# Patient Record
Sex: Male | Born: 1975 | Race: White | Hispanic: No | Marital: Single | Smoking: Never smoker
Health system: Southern US, Community
[De-identification: ages and names within clinical notes are randomized; demographics above are authoritative.]

## PROBLEM LIST (undated history)

## (undated) DIAGNOSIS — Z8614 Personal history of Methicillin resistant Staphylococcus aureus infection: Secondary | ICD-10-CM

## (undated) DIAGNOSIS — N289 Disorder of kidney and ureter, unspecified: Secondary | ICD-10-CM

## (undated) DIAGNOSIS — N200 Calculus of kidney: Secondary | ICD-10-CM

## (undated) DIAGNOSIS — F419 Anxiety disorder, unspecified: Secondary | ICD-10-CM

## (undated) HISTORY — DX: Anxiety disorder, unspecified: F41.9

---

## 2011-09-03 ENCOUNTER — Emergency Department (HOSPITAL_COMMUNITY)
Admission: EM | Admit: 2011-09-03 | Discharge: 2011-09-03 | Disposition: A | Discharge status: Home / Self Care | Payer: Self-pay | Attending: Emergency Medicine | Admitting: Emergency Medicine

## 2011-09-03 ENCOUNTER — Encounter: Payer: Self-pay | Admitting: *Deleted

## 2011-09-03 ENCOUNTER — Emergency Department (HOSPITAL_COMMUNITY): Payer: Self-pay

## 2011-09-03 DIAGNOSIS — K5289 Other specified noninfective gastroenteritis and colitis: Secondary | ICD-10-CM | POA: Insufficient documentation

## 2011-09-03 DIAGNOSIS — R109 Unspecified abdominal pain: Secondary | ICD-10-CM | POA: Insufficient documentation

## 2011-09-03 DIAGNOSIS — K529 Noninfective gastroenteritis and colitis, unspecified: Secondary | ICD-10-CM

## 2011-09-03 DIAGNOSIS — R112 Nausea with vomiting, unspecified: Secondary | ICD-10-CM | POA: Insufficient documentation

## 2011-09-03 DIAGNOSIS — M549 Dorsalgia, unspecified: Secondary | ICD-10-CM | POA: Insufficient documentation

## 2011-09-03 DIAGNOSIS — R350 Frequency of micturition: Secondary | ICD-10-CM | POA: Insufficient documentation

## 2011-09-03 DIAGNOSIS — R197 Diarrhea, unspecified: Secondary | ICD-10-CM | POA: Insufficient documentation

## 2011-09-03 HISTORY — DX: Personal history of Methicillin resistant Staphylococcus aureus infection: Z86.14

## 2011-09-03 HISTORY — DX: Disorder of kidney and ureter, unspecified: N28.9

## 2011-09-03 HISTORY — DX: Calculus of kidney: N20.0

## 2011-09-03 LAB — COMPREHENSIVE METABOLIC PANEL
BUN: 11 mg/dL (ref 6–23)
CO2: 21 mEq/L (ref 19–32)
Calcium: 9.7 mg/dL (ref 8.4–10.5)
GFR calc Af Amer: >90 mL/min (ref >90)
GFR calc non Af Amer: >90 mL/min (ref >90)
Glucose, Bld: 146 mg/dL — ABNORMAL HIGH (ref 70–99)
Total Protein: 7.3 g/dL (ref 6.0–8.3)

## 2011-09-03 LAB — CBC
HCT: 46.3 % (ref 39.0–52.0)
Hemoglobin: 17.1 g/dL — ABNORMAL HIGH (ref 13.0–17.0)
MCH: 32.3 pg (ref 26.0–34.0)
MCV: 87.4 fL (ref 78.0–100.0)
RBC: 5.3 MIL/uL (ref 4.22–5.81)

## 2011-09-03 LAB — URINALYSIS, ROUTINE W REFLEX MICROSCOPIC
Bilirubin Urine: NEGATIVE
Nitrite: NEGATIVE
Protein, ur: NEGATIVE mg/dL
Specific Gravity, Urine: 1.013 (ref 1.005–1.030)
Urobilinogen, UA: 0.2 mg/dL (ref 0.0–1.0)

## 2011-09-03 LAB — DIFFERENTIAL
Eosinophils Absolute: 0 10*3/uL (ref 0.0–0.7)
Eosinophils Relative: 0 % (ref 0–5)
Lymphs Abs: 1.5 10*3/uL (ref 0.7–4.0)
Monocytes Absolute: 0.8 10*3/uL (ref 0.1–1.0)
Monocytes Relative: 6 % (ref 3–12)

## 2011-09-03 LAB — LIPASE, BLOOD: Lipase: 20 U/L (ref 11–59)

## 2011-09-03 MED ORDER — MORPHINE SULFATE 4 MG/ML IJ SOLN
4.0000 mg | Freq: Once | INTRAMUSCULAR | Status: AC
  Administered 2011-09-03: 4 mg via INTRAVENOUS
  Filled 2011-09-03: qty 1

## 2011-09-03 MED ORDER — ONDANSETRON HCL 4 MG/2ML IJ SOLN
4.0000 mg | Freq: Once | INTRAMUSCULAR | Status: AC
  Administered 2011-09-03: 4 mg via INTRAVENOUS
  Filled 2011-09-03: qty 2

## 2011-09-03 MED ORDER — SODIUM CHLORIDE 0.9 % IV BOLUS (SEPSIS)
1000.0000 mL | Freq: Once | INTRAVENOUS | Status: AC
  Administered 2011-09-03: 1000 mL via INTRAVENOUS

## 2011-09-03 MED ORDER — SODIUM CHLORIDE 0.9 % IV SOLN
Freq: Once | INTRAVENOUS | Status: AC
Start: 2011-09-03 — End: 2011-09-03
  Administered 2011-09-03: 09:00:00 via INTRAVENOUS

## 2011-09-03 MED ORDER — ONDANSETRON HCL 4 MG PO TABS: 4.0000 mg | ORAL_TABLET | Freq: Four times a day (QID) | ORAL | Status: AC

## 2011-09-03 MED ORDER — IOHEXOL 300 MG/ML  SOLN
100.0000 mL | Freq: Once | INTRAMUSCULAR | Status: AC | PRN
  Administered 2011-09-03: 100 mL via INTRAVENOUS

## 2011-09-03 MED ORDER — OXYCODONE-ACETAMINOPHEN 5-325 MG PO TABS: 2.0000 | ORAL_TABLET | ORAL | Status: AC | PRN

## 2011-09-03 NOTE — ED Notes (Signed)
Pt complaining of generalized abdominal pain. Pt states that the pain is also in his back. Pt states that he has been having difficulty urinating. Pt in severe pain. Pt unable to fully answer questions at this time. Report given to [facility]

## 2011-09-03 NOTE — ED Notes (Signed)
To CT scan

## 2011-09-03 NOTE — ED Notes (Signed)
Family at bedside. 

## 2011-09-03 NOTE — ED Notes (Signed)
Patient is resting comfortably. 

## 2011-09-03 NOTE — ED Notes (Signed)
Family at bedside. Reports pain med received earlier not working, requested more pain med. Medicated per order. Oral contrast given for CT scan. Encouraged to drink. States nausea improved "a little".

## 2011-09-03 NOTE — ED Notes (Signed)
Family at bedside. Finished drinking contrast, CT notified.

## 2011-09-03 NOTE — ED Notes (Addendum)
C/o stomach pain. "feels like i have been having a UTI for ~ 1 month", stomach pain onset ~ 12 hrs, nv onset ~ 6 hrs, pt writhing in pain and with active nv. Also reports back pain with h/o kidney stones. Unable to pinpoint pain, also mentions CP. Last kidney stone on Left ~ 5-6 yrs ago. Pt taken straight back to room via w/c on arrival to triage.

## 2011-09-03 NOTE — ED Provider Notes (Addendum)
History     CSN: [id number] Arrival date & time: 09/03/2011  7:14 AM   First MD Initiated Contact with Patient 09/03/11 [id number]      Chief Complaint  Patient presents with  . Abdominal Pain    also flank pain, UTI sx, CP, nv    (Consider location/radiation/quality/duration/timing/severity/associated sxs/prior treatment) Patient is a 35 y.o. male presenting with abdominal pain.  Abdominal Pain The primary symptoms of the illness include abdominal pain.  Additional symptoms associated with the illness include frequency and back pain. Symptoms associated with the illness do not include chills, constipation or hematuria.   patient states his symptoms began approximately 12 hours ago. This includes diffuse abdominal pain, back pain, nausea, vomiting, and diarrhea. Patient states she works in a [employer] and may have been exposed to something, he does not know. He also, states she's been having increasing urinary frequency over the past 2 days. He's had no recent travel, no fevers. He denies any previous abdominal surgeries or any previous medical problems. Other than having MRSA in the past. She did not take anything for the pain prior to arrival.  Past Medical History  Diagnosis Date  . Renal disorder   . Kidney stone   . Hx MRSA infection     History reviewed. No pertinent past surgical history.  History reviewed. No pertinent family history.  History  Substance Use Topics  . Smoking status: Never Smoker   . Smokeless tobacco: Not on file  . Alcohol Use: No      Review of Systems  Constitutional: Negative for chills.  Gastrointestinal: Positive for abdominal pain. Negative for constipation.  Genitourinary: Positive for frequency. Negative for hematuria.  Musculoskeletal: Positive for back pain.  All other systems reviewed and are negative.    Allergies  Review of patient's allergies indicates no known allergies.  Home Medications  No current outpatient prescriptions  on file.  BP 182/80  Pulse 43  Temp(Src) 97.5 F (36.4 C) (Oral)  Resp 20  SpO2 99%  Physical Exam  Constitutional: He is oriented to person, place, and time. He appears well-developed and well-nourished.  HENT:  Head: Normocephalic and atraumatic.  Eyes: Conjunctivae and EOM are normal. Pupils are equal, round, and reactive to light.  Neck: Neck supple.  Cardiovascular: Normal rate and regular rhythm.  Exam reveals no gallop and no friction rub.   No murmur heard. Pulmonary/Chest: Breath sounds normal. He has no wheezes. He has no rales. He exhibits no tenderness.  Abdominal: Soft. Bowel sounds are normal. He exhibits no distension and no mass. There is tenderness. There is no rebound and no guarding.       Diffuse abdominal tenderness, prominent in the epigastric region. No rebound or guarding noted. Bowel sounds are normal.  Musculoskeletal: Normal range of motion.  Neurological: He is alert and oriented to person, place, and time. No cranial nerve deficit. Coordination normal.  Skin: Skin is warm and dry. No rash noted.  Psychiatric: He has a normal mood and affect.    ED Course  Procedures (including critical care time)  Labs Reviewed  CBC - Abnormal; Notable for the following:    WBC 14.0 (*)    Hemoglobin 17.1 (*)    MCHC 36.9 (*)    All other components within normal limits  DIFFERENTIAL - Abnormal; Notable for the following:    Neutrophils Relative 83 (*)    Neutro Abs 11.6 (*)    Lymphocytes Relative 11 (*)    All other  components within normal limits  COMPREHENSIVE METABOLIC PANEL - Abnormal; Notable for the following:    Potassium 3.2 (*)    Glucose, Bld 146 (*)    All other components within normal limits  URINALYSIS, ROUTINE W REFLEX MICROSCOPIC - Abnormal; Notable for the following:    pH 8.5 (*)    Ketones, ur 40 (*)    All other components within normal limits  LIPASE, BLOOD   Ct Abdomen Pelvis W Contrast  09/03/2011  *RADIOLOGY REPORT*  Clinical  Data: Abdominal pain  CT ABDOMEN AND PELVIS WITH CONTRAST  Technique:  Multidetector CT imaging of the abdomen and pelvis was performed following the standard protocol during bolus administration of intravenous contrast.  Contrast: OMNIPAQUE IOHEXOL 300 MG/ML IV SOLN  Comparison: None  Findings: Lung bases are clear.  No pericardial or pleural effusion.  No focal liver abnormality identified.  The gallbladder appears normal.  The spleen appears within normal limits.  No biliary dilatation.  The pancreas is unremarkable.  There are several small hypodense structures within the right kidney.  Too small to characterize. Within the posterior cortex of the left kidney there is an indeterminant, intermediate density structure measuring 1.3 cm, image 32.  Within the inferior pole left kidney there is a small hypodensity which is too small to characterize.  No evidence for obstructive uropathy.  No enlarged upper abdominal lymph nodes.  There is no pelvic or inguinal adenopathy.  Urinary bladder appears normal.  The stomach is unremarkable.  Mild increased caliber of the proximal small bowel loops.  The appendix is within normal limits.  The entire colon is collapsed and there is prominence of the colonic wall which likely reflects incomplete distention. Given the absence of surrounding inflammatory change  a pancolitis is considered less favored.  The urinary bladder appears within normal limits.  No free fluid or abnormal fluid collections identified.  Review of the visualized osseous structures is unremarkable.  IMPRESSION:  1.  There is a prominence of the entire colonic wall which is likely due to diffuse incomplete distention.  Correlate for any clinical signs or symptoms of colitis. 2.  Within the left upper quadrant the abdomen there is a focal loop of small bowel which is upper limits of normal measuring 3 cm. Differential for this appearance includes focal ileus, mild enteritis or partial obstruction. 3.   There is a low density structure within the inferior pole of the left kidney which is of intermediate density. This is indeterminate and may represent a proteinaceous or hemorrhagic cyst.  Cystic renal cell carcinoma is not excluded.  Recommend follow-up with a non emergent contrast-enhanced MRI.  Original Report Authenticated By: Rosealee Albee, M.D.     No diagnosis found.    MDM  Patient is seen and examined, initial history and physical is completed. Evaluation initiated        Ashley Bultema A. Patrica Duel, MD 09/03/11 0725  10:26 AM Results for orders placed during the hospital encounter of 09/03/11  CBC      Component Value Range   WBC 14.0 (*) 4.0 - 10.5 (K/uL)   RBC 5.30  4.22 - 5.81 (MIL/uL)   Hemoglobin 17.1 (*) 13.0 - 17.0 (g/dL)   HCT 11.9  14.7 - 82.9 (%)   MCV 87.4  78.0 - 100.0 (fL)   MCH 32.3  26.0 - 34.0 (pg)   MCHC 36.9 (*) 30.0 - 36.0 (g/dL)   RDW 56.2  13.0 - 86.5 (%)   Platelets 234  150 - 400 (K/uL)  DIFFERENTIAL      Component Value Range   Neutrophils Relative 83 (*) 43 - 77 (%)   Neutro Abs 11.6 (*) 1.7 - 7.7 (K/uL)   Lymphocytes Relative 11 (*) 12 - 46 (%)   Lymphs Abs 1.5  0.7 - 4.0 (K/uL)   Monocytes Relative 6  3 - 12 (%)   Monocytes Absolute 0.8  0.1 - 1.0 (K/uL)   Eosinophils Relative 0  0 - 5 (%)   Eosinophils Absolute 0.0  0.0 - 0.7 (K/uL)   Basophils Relative 0  0 - 1 (%)   Basophils Absolute 0.0  0.0 - 0.1 (K/uL)  COMPREHENSIVE METABOLIC PANEL      Component Value Range   Sodium 143  135 - 145 (mEq/L)   Potassium 3.2 (*) 3.5 - 5.1 (mEq/L)   Chloride 106  96 - 112 (mEq/L)   CO2 21  19 - 32 (mEq/L)   Glucose, Bld 146 (*) 70 - 99 (mg/dL)   BUN 11  6 - 23 (mg/dL)   Creatinine, Ser 4.09  0.50 - 1.35 (mg/dL)   Calcium 9.7  8.4 - 81.1 (mg/dL)   Total Protein 7.3  6.0 - 8.3 (g/dL)   Albumin 4.5  3.5 - 5.2 (g/dL)   AST 22  0 - 37 (U/L)   ALT 35  0 - 53 (U/L)   Alkaline Phosphatase 78  39 - 117 (U/L)   Total Bilirubin 0.4  0.3 - 1.2 (mg/dL)    GFR calc non Af Amer >90  >90 (mL/min)   GFR calc Af Amer >90  >90 (mL/min)  LIPASE, BLOOD      Component Value Range   Lipase 20  11 - 59 (U/L)  URINALYSIS, ROUTINE W REFLEX MICROSCOPIC      Component Value Range   Color, Urine YELLOW  YELLOW    Appearance CLEAR  CLEAR    Specific Gravity, Urine 1.013  1.005 - 1.030    pH 8.5 (*) 5.0 - 8.0    Glucose, UA NEGATIVE  NEGATIVE (mg/dL)   Hgb urine dipstick NEGATIVE  NEGATIVE    Bilirubin Urine NEGATIVE  NEGATIVE    Ketones, ur 40 (*) NEGATIVE (mg/dL)   Protein, ur NEGATIVE  NEGATIVE (mg/dL)   Urobilinogen, UA 0.2  0.0 - 1.0 (mg/dL)   Nitrite NEGATIVE  NEGATIVE    Leukocytes, UA NEGATIVE  NEGATIVE    No results found.    Nyema Hachey A. Patrica Duel, MD 09/03/11 9147  Lorelle Gibbs. Christal Lagerstrom, MD 09/03/11 0827   Medications  morphine 4 MG/ML injection 4 mg (4 mg Intravenous Given 09/03/11 0737)  sodium chloride 0.9 % bolus 1,000 mL (1000 mL Intravenous Given 09/03/11 0737)  ondansetron (ZOFRAN) injection 4 mg (4 mg Intravenous Given 09/03/11 0737)  morphine 4 MG/ML injection 4 mg (4 mg Intravenous Given 09/03/11 0813)  ondansetron (ZOFRAN) injection 4 mg (4 mg Intravenous Given 09/03/11 0907)  0.9 %  sodium chloride infusion (  Intravenous New Bag 09/03/11 0907)  iohexol (OMNIPAQUE) 300 MG/ML injection 100 mL (100 mL Intravenous Contrast Given 09/03/11 0956)       Theron Arista A. Patrica Duel, MD 09/03/11 8295   10:34 AM Results of urine test, blood tests and CAT scan discussed at length with family. Focal loop of small bowel upper limits of normal, may include focal ileus or mild enteritis. Also, low-density structure in the inferior pole. The left kidney, which is indeterminate, recommending followup with non-emergent MRI. Patient feels better. Would like  to try to go home. We'll send him home on a diet of clear liquids for 24 hours, pain, and nausea medication, recommending followup with primary doctor this week or 2. Return to the ER for any  concerns   Theron Arista A. Patrica Duel, MD 09/03/11 1035

## 2011-10-11 ENCOUNTER — Emergency Department (HOSPITAL_COMMUNITY): Payer: Self-pay

## 2011-10-11 ENCOUNTER — Emergency Department (HOSPITAL_COMMUNITY)
Admission: EM | Admit: 2011-10-11 | Discharge: 2011-10-11 | Disposition: A | Discharge status: Home / Self Care | Payer: Self-pay | Attending: Emergency Medicine | Admitting: Emergency Medicine

## 2011-10-11 ENCOUNTER — Encounter (HOSPITAL_COMMUNITY): Payer: Self-pay | Admitting: Emergency Medicine

## 2011-10-11 DIAGNOSIS — M549 Dorsalgia, unspecified: Secondary | ICD-10-CM | POA: Insufficient documentation

## 2011-10-11 DIAGNOSIS — N289 Disorder of kidney and ureter, unspecified: Secondary | ICD-10-CM | POA: Insufficient documentation

## 2011-10-11 DIAGNOSIS — N200 Calculus of kidney: Secondary | ICD-10-CM | POA: Insufficient documentation

## 2011-10-11 DIAGNOSIS — R109 Unspecified abdominal pain: Secondary | ICD-10-CM | POA: Insufficient documentation

## 2011-10-11 LAB — HEPATIC FUNCTION PANEL
Albumin: 4.6 g/dL (ref 3.5–5.2)
Alkaline Phosphatase: 81 U/L (ref 39–117)
Total Protein: 7.1 g/dL (ref 6.0–8.3)

## 2011-10-11 LAB — URINALYSIS, ROUTINE W REFLEX MICROSCOPIC
Bilirubin Urine: NEGATIVE
Leukocytes, UA: NEGATIVE
Nitrite: NEGATIVE
Specific Gravity, Urine: 1.014 (ref 1.005–1.030)
Urobilinogen, UA: 0.2 mg/dL (ref 0.0–1.0)
pH: 8.5 — ABNORMAL HIGH (ref 5.0–8.0)

## 2011-10-11 LAB — DIFFERENTIAL
Basophils Absolute: 0 10*3/uL (ref 0.0–0.1)
Basophils Relative: 0 % (ref 0–1)
Eosinophils Absolute: 0 10*3/uL (ref 0.0–0.7)
Eosinophils Relative: 0 % (ref 0–5)
Lymphs Abs: 1.5 10*3/uL (ref 0.7–4.0)
Neutrophils Relative %: 84 % — ABNORMAL HIGH (ref 43–77)

## 2011-10-11 LAB — CBC
MCH: 32.1 pg (ref 26.0–34.0)
MCV: 87.1 fL (ref 78.0–100.0)
Platelets: 221 10*3/uL (ref 150–400)
RBC: 4.98 MIL/uL (ref 4.22–5.81)
RDW: 12.5 % (ref 11.5–15.5)
WBC: 14.4 10*3/uL — ABNORMAL HIGH (ref 4.0–10.5)

## 2011-10-11 LAB — POCT I-STAT, CHEM 8
Chloride: 105 mEq/L (ref 96–112)
Glucose, Bld: 136 mg/dL — ABNORMAL HIGH (ref 70–99)
HCT: 47 % (ref 39.0–52.0)
Hemoglobin: 16 g/dL (ref 13.0–17.0)
Potassium: 3.1 mEq/L — ABNORMAL LOW (ref 3.5–5.1)
Sodium: 144 mEq/L (ref 135–145)

## 2011-10-11 LAB — LIPASE, BLOOD: Lipase: 16 U/L (ref 11–59)

## 2011-10-11 MED ORDER — ONDANSETRON HCL 4 MG/2ML IJ SOLN
4.0000 mg | Freq: Once | INTRAMUSCULAR | Status: AC
  Administered 2011-10-11: 4 mg via INTRAVENOUS
  Filled 2011-10-11 (×2): qty 2

## 2011-10-11 MED ORDER — POTASSIUM CHLORIDE CRYS ER 20 MEQ PO TBCR
40.0000 meq | EXTENDED_RELEASE_TABLET | Freq: Once | ORAL | Status: AC
  Administered 2011-10-11: 40 meq via ORAL
  Filled 2011-10-11: qty 2

## 2011-10-11 MED ORDER — TAMSULOSIN HCL 0.4 MG PO CAPS: 0.4000 mg | ORAL_CAPSULE | Freq: Every day | ORAL | Status: AC

## 2011-10-11 MED ORDER — FENTANYL CITRATE 0.05 MG/ML IJ SOLN
100.0000 ug | Freq: Once | INTRAMUSCULAR | Status: AC
  Administered 2011-10-11: 100 ug via INTRAVENOUS
  Filled 2011-10-11: qty 2

## 2011-10-11 MED ORDER — IOHEXOL 300 MG/ML  SOLN
100.0000 mL | Freq: Once | INTRAMUSCULAR | Status: AC | PRN
  Administered 2011-10-11: 100 mL via INTRAVENOUS

## 2011-10-11 MED ORDER — ONDANSETRON HCL 4 MG/2ML IJ SOLN
4.0000 mg | Freq: Once | INTRAMUSCULAR | Status: AC
  Administered 2011-10-11: 4 mg via INTRAVENOUS
  Filled 2011-10-11: qty 2

## 2011-10-11 MED ORDER — MORPHINE SULFATE 4 MG/ML IJ SOLN
4.0000 mg | Freq: Once | INTRAMUSCULAR | Status: AC
  Administered 2011-10-11: 4 mg via INTRAVENOUS
  Filled 2011-10-11: qty 1

## 2011-10-11 MED ORDER — IOHEXOL 300 MG/ML  SOLN
40.0000 mL | Freq: Once | INTRAMUSCULAR | Status: AC | PRN
  Administered 2011-10-11: 40 mL via ORAL

## 2011-10-11 MED ORDER — KETOROLAC TROMETHAMINE 30 MG/ML IJ SOLN
30.0000 mg | Freq: Once | INTRAMUSCULAR | Status: AC
  Administered 2011-10-11: 30 mg via INTRAVENOUS
  Filled 2011-10-11: qty 1

## 2011-10-11 MED ORDER — OXYCODONE-ACETAMINOPHEN 5-325 MG PO TABS: 1.0000 | ORAL_TABLET | Freq: Four times a day (QID) | ORAL | Status: AC | PRN

## 2011-10-11 MED ORDER — TAMSULOSIN HCL 0.4 MG PO CAPS
0.4000 mg | ORAL_CAPSULE | ORAL | Status: AC
  Administered 2011-10-11: 0.4 mg via ORAL
  Filled 2011-10-11: qty 1

## 2011-10-11 NOTE — ED Provider Notes (Addendum)
History     CSN: [id number] Arrival date & time: 10/11/2011 12:52 AM   First MD Initiated Contact with Patient 10/11/11 0142      Chief Complaint  Patient presents with  . Abdominal Pain    (Consider location/radiation/quality/duration/timing/severity/associated sxs/prior treatment) Patient is a 35 y.o. male presenting with abdominal pain and flank pain. The history is provided by the patient. No language interpreter was used.  Abdominal Pain The primary symptoms of the illness include abdominal pain. The primary symptoms of the illness do not include shortness of breath or dysuria. The current episode started 3 to 5 hours ago. The onset of the illness was sudden. The problem has not changed since onset. The patient has not had a change in bowel habit. Additional symptoms associated with the illness include back pain. Symptoms associated with the illness do not include chills, anorexia, diaphoresis, heartburn, constipation, urgency, hematuria or frequency. Associated symptoms comments: Nausea and vomiting. Significant associated medical issues do not include PUD, GERD, inflammatory bowel disease, diabetes, sickle cell disease, gallstones, liver disease, substance abuse, diverticulitis, HIV or cardiac disease.  Flank Pain This is a new problem. The current episode started 6 to 12 hours ago. The problem occurs constantly. The problem has not changed since onset.Associated symptoms include abdominal pain. Pertinent negatives include no chest pain, no headaches and no shortness of breath. He has tried nothing for the symptoms. The treatment provided no relief.  Pain is in the left flank.  States he was seen for same recently and given no diagnosis  Past Medical History  Diagnosis Date  . Renal disorder   . Kidney stone   . Hx MRSA infection   . Kidney stone     History reviewed. No pertinent past surgical history.  No family history on file.  History  Substance Use Topics  . Smoking  status: Never Smoker   . Smokeless tobacco: Not on file  . Alcohol Use: No      Review of Systems  Constitutional: Negative for chills and diaphoresis.  HENT: Negative for facial swelling.   Eyes: Negative for discharge.  Respiratory: Negative for apnea and shortness of breath.   Cardiovascular: Negative for chest pain.  Gastrointestinal: Positive for abdominal pain. Negative for heartburn, constipation and anorexia.  Genitourinary: Positive for flank pain. Negative for dysuria, urgency, frequency and hematuria.  Musculoskeletal: Positive for back pain. Negative for gait problem.  Neurological: Negative for dizziness and headaches.  Hematological: Negative.   Psychiatric/Behavioral: Negative.     Allergies  Review of patient's allergies indicates no known allergies.  Home Medications   Current Outpatient Rx  Name Route Sig Dispense Refill  . IBUPROFEN 200 MG PO TABS Oral Take 200 mg by mouth every 6 (six) hours as needed. Pain or headache       BP 148/82  Pulse 51  Temp(Src) 97.5 F (36.4 C) (Oral)  Resp 18  SpO2 99%  Physical Exam  Constitutional: He is oriented to person, place, and time. He appears well-developed and well-nourished. No distress.  HENT:  Head: Normocephalic and atraumatic.  Mouth/Throat: Oropharynx is clear and moist.  Eyes: EOM are normal. Pupils are equal, round, and reactive to light.  Neck: Normal range of motion. Neck supple. No JVD present.  Cardiovascular: Normal rate and regular rhythm.   Pulmonary/Chest: Effort normal and breath sounds normal. No respiratory distress. He has no wheezes. He has no rales.  Abdominal: Soft. Bowel sounds are normal. He exhibits no mass. There is no rebound  and no guarding.  Musculoskeletal: Normal range of motion.  Neurological: He is alert and oriented to person, place, and time.  Skin: Skin is warm and dry.  Psychiatric: Thought content normal.    ED Course  Procedures (including critical care  time)  Labs Reviewed  CBC - Abnormal; Notable for the following:    WBC 14.4 (*)    MCHC 36.9 (*)    All other components within normal limits  DIFFERENTIAL - Abnormal; Notable for the following:    Neutrophils Relative 84 (*)    Neutro Abs 12.1 (*)    Lymphocytes Relative 10 (*)    All other components within normal limits  URINALYSIS, ROUTINE W REFLEX MICROSCOPIC - Abnormal; Notable for the following:    APPearance CLOUDY (*)    pH 8.5 (*)    Ketones, ur 40 (*)    All other components within normal limits  POCT I-STAT, CHEM 8 - Abnormal; Notable for the following:    Potassium 3.1 (*)    Glucose, Bld 136 (*)    Calcium, Ion 1.07 (*)    All other components within normal limits  HEPATIC FUNCTION PANEL  LIPASE, BLOOD  I-STAT, CHEM 8   Ct Abdomen Pelvis W Contrast  10/11/2011  *RADIOLOGY REPORT*  Clinical Data: Left-sided abdominal pain.  CT ABDOMEN AND PELVIS WITH CONTRAST  Technique:  Multidetector CT imaging of the abdomen and pelvis was performed following the standard protocol during bolus administration of intravenous contrast.  Contrast: 40mL OMNIPAQUE IOHEXOL 300 MG/ML IV SOLN, OMNIPAQUE IOHEXOL 300 MG/ML IV SOLN  Comparison: 09/03/2011  Findings: Limited images through the lung bases demonstrate no significant appreciable abnormality. The heart size is within normal limits. No pleural or pericardial effusion.  Diffuse low attenuation of the liver without focal abnormality. Unremarkable biliary system, spleen, pancreas, adrenal glands.  There are a few bilateral too small to further characterize hypodensities.  Minimal pelviectasis on the left no hydroureter. There is a 3 mm mid left ureteral stone (image 53).  No bowel obstruction.  No CT evidence for colitis.  Normal appendix.  No free fluid or free intraperitoneal air.  No lymphadenopathy.  Normal caliber vasculature.  Thin-walled bladder.  Small fat containing right inguinal hernia.  No acute osseous abnormality.   IMPRESSION: 3 mm mid left ureteral stone with associated mild pelviectasis.  Bilateral too small to further characterize renal hypodensities and a 1.3 cm indeterminate hypodensity within the left lower pole.  A non emergent renal MRI was previously (and is again) recommended.  Hepatic steatosis.  Original Report Authenticated By: Waneta Martins, M.D.     No diagnosis found.   Results for orders placed during the hospital encounter of 10/11/11  CBC      Component Value Range   WBC 14.4 (*) 4.0 - 10.5 (K/uL)   RBC 4.98  4.22 - 5.81 (MIL/uL)   Hemoglobin 16.0  13.0 - 17.0 (g/dL)   HCT 16.1  09.6 - 04.5 (%)   MCV 87.1  78.0 - 100.0 (fL)   MCH 32.1  26.0 - 34.0 (pg)   MCHC 36.9 (*) 30.0 - 36.0 (g/dL)   RDW 40.9  81.1 - 91.4 (%)   Platelets 221  150 - 400 (K/uL)  DIFFERENTIAL      Component Value Range   Neutrophils Relative 84 (*) 43 - 77 (%)   Neutro Abs 12.1 (*) 1.7 - 7.7 (K/uL)   Lymphocytes Relative 10 (*) 12 - 46 (%)   Lymphs Abs  1.5  0.7 - 4.0 (K/uL)   Monocytes Relative 5  3 - 12 (%)   Monocytes Absolute 0.8  0.1 - 1.0 (K/uL)   Eosinophils Relative 0  0 - 5 (%)   Eosinophils Absolute 0.0  0.0 - 0.7 (K/uL)   Basophils Relative 0  0 - 1 (%)   Basophils Absolute 0.0  0.0 - 0.1 (K/uL)  HEPATIC FUNCTION PANEL      Component Value Range   Total Protein 7.1  6.0 - 8.3 (g/dL)   Albumin 4.6  3.5 - 5.2 (g/dL)   AST 22  0 - 37 (U/L)   ALT 32  0 - 53 (U/L)   Alkaline Phosphatase 81  39 - 117 (U/L)   Total Bilirubin 0.3  0.3 - 1.2 (mg/dL)   Bilirubin, Direct <1.4  0.0 - 0.3 (mg/dL)   Indirect Bilirubin NOT CALCULATED  0.3 - 0.9 (mg/dL)  LIPASE, BLOOD      Component Value Range   Lipase 16  11 - 59 (U/L)  URINALYSIS, ROUTINE W REFLEX MICROSCOPIC      Component Value Range   Color, Urine YELLOW  YELLOW    APPearance CLOUDY (*) CLEAR    Specific Gravity, Urine 1.014  1.005 - 1.030    pH 8.5 (*) 5.0 - 8.0    Glucose, UA NEGATIVE  NEGATIVE (mg/dL)   Hgb urine dipstick NEGATIVE   NEGATIVE    Bilirubin Urine NEGATIVE  NEGATIVE    Ketones, ur 40 (*) NEGATIVE (mg/dL)   Protein, ur NEGATIVE  NEGATIVE (mg/dL)   Urobilinogen, UA 0.2  0.0 - 1.0 (mg/dL)   Nitrite NEGATIVE  NEGATIVE    Leukocytes, UA NEGATIVE  NEGATIVE   POCT I-STAT, CHEM 8      Component Value Range   Sodium 144  135 - 145 (mEq/L)   Potassium 3.1 (*) 3.5 - 5.1 (mEq/L)   Chloride 105  96 - 112 (mEq/L)   BUN 8  6 - 23 (mg/dL)   Creatinine, Ser 7.82  0.50 - 1.35 (mg/dL)   Glucose, Bld 956 (*) 70 - 99 (mg/dL)   Calcium, Ion 2.13 (*) 1.12 - 1.32 (mmol/L)   TCO2 24  0 - 100 (mmol/L)   Hemoglobin 16.0  13.0 - 17.0 (g/dL)   HCT 08.6  57.8 - 46.9 (%)   Ct Abdomen Pelvis W Contrast  10/11/2011  *RADIOLOGY REPORT*  Clinical Data: Left-sided abdominal pain.  CT ABDOMEN AND PELVIS WITH CONTRAST  Technique:  Multidetector CT imaging of the abdomen and pelvis was performed following the standard protocol during bolus administration of intravenous contrast.  Contrast: 40mL OMNIPAQUE IOHEXOL 300 MG/ML IV SOLN, OMNIPAQUE IOHEXOL 300 MG/ML IV SOLN  Comparison: 09/03/2011  Findings: Limited images through the lung bases demonstrate no significant appreciable abnormality. The heart size is within normal limits. No pleural or pericardial effusion.  Diffuse low attenuation of the liver without focal abnormality. Unremarkable biliary system, spleen, pancreas, adrenal glands.  There are a few bilateral too small to further characterize hypodensities.  Minimal pelviectasis on the left no hydroureter. There is a 3 mm mid left ureteral stone (image 53).  No bowel obstruction.  No CT evidence for colitis.  Normal appendix.  No free fluid or free intraperitoneal air.  No lymphadenopathy.  Normal caliber vasculature.  Thin-walled bladder.  Small fat containing right inguinal hernia.  No acute osseous abnormality.  IMPRESSION: 3 mm mid left ureteral stone with associated mild pelviectasis.  Bilateral too small to further characterize  renal hypodensities and a 1.3 cm indeterminate hypodensity within the left lower pole.  A non emergent renal MRI was previously (and is again) recommended.  Hepatic steatosis.  Original Report Authenticated By: Waneta Martins, M.D.    MDM  Patient informed of renal lesions and need for outpatient MRI of the abdomen to further characterize lesions.  Patient and parents informed he will need to follow up in 1 week with urology, GI for ongoing vomiting and a family doctor to establish care and obtain follow up abdominal MRI, all 3 individuals verbalize understanding and patient agrees to follow up        Danetra Glock K Prentiss Polio-Rasch, MD 10/11/11 0525  Lavenia Stumpo K Marriana Hibberd-Rasch, MD 10/11/11 [phone]

## 2011-10-11 NOTE — ED Notes (Signed)
Pt c/o left abd pain and left flank pain onset approx 5-6 hrs ago with nausea and vomiting.

## 2011-10-17 ENCOUNTER — Ambulatory Visit: Payer: Self-pay

## 2011-10-17 DIAGNOSIS — R109 Unspecified abdominal pain: Secondary | ICD-10-CM

## 2012-01-05 ENCOUNTER — Ambulatory Visit: Payer: Self-pay | Admitting: Family Medicine

## 2012-01-05 ENCOUNTER — Ambulatory Visit: Payer: Self-pay

## 2012-01-05 DIAGNOSIS — N200 Calculus of kidney: Secondary | ICD-10-CM

## 2012-01-05 DIAGNOSIS — R109 Unspecified abdominal pain: Secondary | ICD-10-CM

## 2012-01-05 DIAGNOSIS — R3129 Other microscopic hematuria: Secondary | ICD-10-CM

## 2012-01-05 DIAGNOSIS — R1110 Vomiting, unspecified: Secondary | ICD-10-CM

## 2012-01-05 DIAGNOSIS — R1084 Generalized abdominal pain: Secondary | ICD-10-CM

## 2012-01-05 LAB — POCT UA - MICROSCOPIC ONLY
Casts, Ur, LPF, POC: NEGATIVE
Yeast, UA: NEGATIVE

## 2012-01-05 LAB — POCT CBC
Lymph, poc: 2.2 (ref 0.6–3.4)
MCH, POC: 31.4 pg — AB (ref 27–31.2)
MCHC: 33.7 g/dL (ref 31.8–35.4)
MID (cbc): 0.7 (ref 0–0.9)
MPV: 8.8 fL (ref 0–99.8)
POC Granulocyte: 14.7 — AB (ref 2–6.9)
POC LYMPH PERCENT: 12.4 %L (ref 10–50)
POC MID %: 4.1 %M (ref 0–12)
Platelet Count, POC: 296 10*3/uL (ref 142–424)
RDW, POC: 13 %
WBC: 17.6 10*3/uL — AB (ref 4.6–10.2)

## 2012-01-05 LAB — POCT URINALYSIS DIPSTICK
Bilirubin, UA: NEGATIVE
Blood, UA: NEGATIVE
Glucose, UA: NEGATIVE
Leukocytes, UA: NEGATIVE
Nitrite, UA: NEGATIVE
Urobilinogen, UA: 0.2
pH, UA: >=9

## 2012-01-05 MED ORDER — ONDANSETRON 4 MG PO TBDP: 4.0000 mg | ORAL_TABLET | Freq: Three times a day (TID) | ORAL | Status: AC | PRN

## 2012-01-05 MED ORDER — ONDANSETRON 4 MG PO TBDP
4.0000 mg | ORAL_TABLET | Freq: Once | ORAL | Status: AC
  Administered 2012-01-05: 4 mg via ORAL

## 2012-01-05 MED ORDER — CIPROFLOXACIN HCL 500 MG PO TABS: 500.0000 mg | ORAL_TABLET | Freq: Two times a day (BID) | ORAL | Status: AC

## 2012-01-05 MED ORDER — HYDROCODONE-ACETAMINOPHEN 5-500 MG PO TABS: ORAL_TABLET | ORAL | Status: AC

## 2012-01-05 MED ORDER — KETOROLAC TROMETHAMINE 60 MG/2ML IM SOLN
60.0000 mg | Freq: Once | INTRAMUSCULAR | Status: AC
  Administered 2012-01-05: 60 mg via INTRAMUSCULAR

## 2012-01-05 NOTE — Progress Notes (Signed)
Subjective Patient is developed severe abdominal pain in his left hand in his left flank for the past 6 or 8 hours. He did vomiting consistently. Says he vomited maybe 25 times. Has a history of kidney stones. The pain is not point specific, but hurts in the left abdomen around the left flank. He denies abuse of medications. Occasionally smokes a synthetic pot. Not on any other drugs. 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 He worked this morning is when the pain stopped started and he had to stop.  Objective: Chest clear heart regular abdomen had bowel sounds soft without masses has some generalized tenderness more on the left side moderate left flank tenderness he looks like he is in a good deal of discomfort. They're to panzer vomitus in the room.     Results for orders placed in visit on 01/05/12  POCT CBC      Component Value Range   WBC 17.6 (*) 4.6 - 10.2 (K/uL)   Lymph, poc 2.2  0.6 - 3.4    POC LYMPH PERCENT 12.4  10 - 50 (%L)   MID (cbc) 0.7  0 - 0.9    POC MID % 4.1  0 - 12 (%M)   POC Granulocyte 14.7 (*) 2 - 6.9    Granulocyte percent 83.5 (*) 37 - 80 (%G)   RBC 5.47  4.69 - 6.13 (M/uL)   Hemoglobin 17.2  14.1 - 18.1 (g/dL)   HCT, POC 16.1  09.6 - 53.7 (%)   MCV 93.3  80 - 97 (fL)   MCH, POC 31.4 (*) 27 - 31.2 (pg)   MCHC 33.7  31.8 - 35.4 (g/dL)   RDW, POC 04.5     Platelet Count, POC 296  142 - 424 (K/uL)   MPV 8.8  0 - 99.8 (fL)  POCT UA - MICROSCOPIC ONLY      Component Value Range   WBC, Ur, HPF, POC 4-7     RBC, urine, microscopic 10-12     Bacteria, U Microscopic 3+     Mucus, UA pos     Epithelial cells, urine per micros 0-2     Crystals, Ur, HPF, POC calcium oxalate     Casts, Ur, LPF, POC neg     Yeast, UA neg    POCT URINALYSIS DIPSTICK      Component Value Range   Color, UA yellow     Clarity, UA cloudy'     Glucose, UA neg     Bilirubin, UA neg     Ketones, UA 40     Spec Grav, UA 1.015     Blood, UA neg     pH, UA  >=9.0     Protein, UA 100     Urobilinogen, UA 0.2     Nitrite, UA neg     Leukocytes, UA Negative     [facility] reading (PRIMARY) by  Dr. Alwyn Ren No free air. No stones seen . Assessment: Chronic abdominal pain, consistent with kidney stone. Microscopic hematuria. Hydration.  : Gave Zofran and an injection of Toradol. The pain has let up a little bit. He is still nauseated. Vomited more water. We'll give some IV fluids and see how he does. Probably we'll try and send him home and on antibiotics and then have her go to emergency room if he starts running a fever getting more intense pain. Even though I do not see a stone on his x-ray. Did  not feel like  CAT scan of his kidneys necessary since he just had a stone a few months ago.   Handwrote flomax.  Can take it in a day or two when hydrated.

## 2012-01-05 NOTE — Patient Instructions (Signed)
I believe that your symptoms are from the kidney stone. However with your elevated white blood count I have some concern. If you start getting worse pain, not keeping things down still, fever, or in any way or just getting sicker you need to go promptly to the emergency room. Take the antibiotic and nausea medicine as directed. You do not need anything solid, and it would be best to just stick with liquids and broth. Avoid alcohol and milk.  Strain urine.  Unless you are much better by tomorrow I advised to come back for a recheck.

## 2012-01-07 LAB — URINE CULTURE: Organism ID, Bacteria: NO GROWTH

## 2012-10-04 IMAGING — CT CT ABD-PELV W/ CM
1 of 2 series · 15 of 32 positions shown, 19 images · IV contrast (APPLIED)
Comparison: 09/03/2011

CLINICAL DATA: Left-sided abdominal pain.

CT ABDOMEN AND PELVIS WITH CONTRAST
TECHNIQUE: Multidetector CT imaging of the abdomen and pelvis was
performed following the standard protocol during bolus
administration of intravenous contrast.
Contrast: 40mL OMNIPAQUE IOHEXOL 300 MG/ML IV SOLN, 100mL OMNIPAQUE
IOHEXOL 300 MG/ML IV SOLN

[Series 2: abd/pelv with 5.0 b31f st · axial · 0.70mm/px · z∈[-494,-74]mm · 15 of 93 slices shown, 19 images]
[im 5/93  soft-tissue]
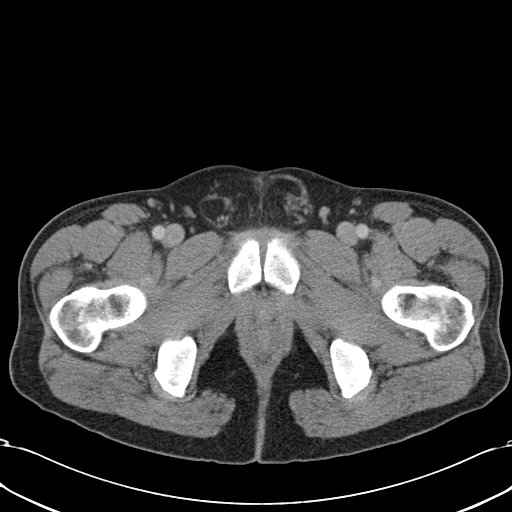
[im 5/93  bone]
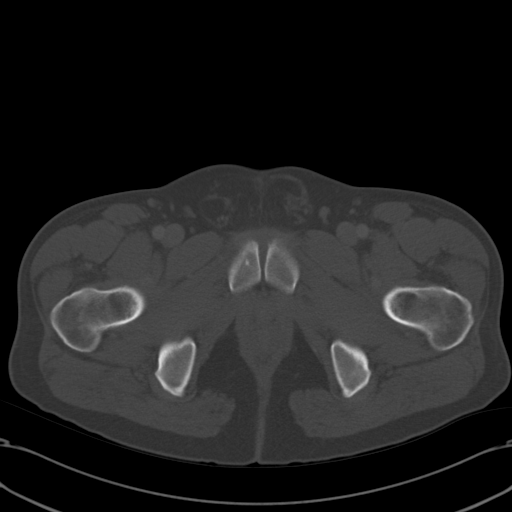
[im 13/93  soft-tissue]
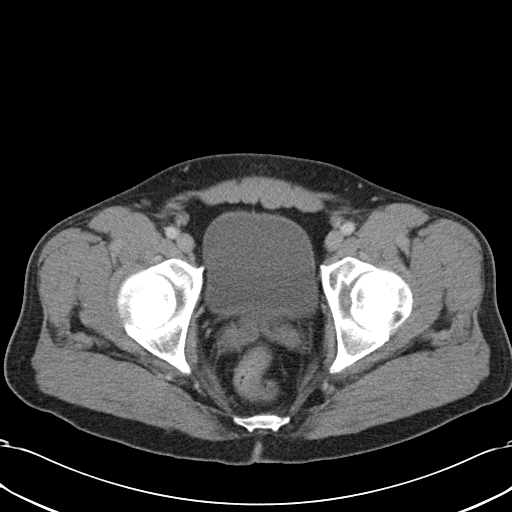
[im 21/93  soft-tissue]
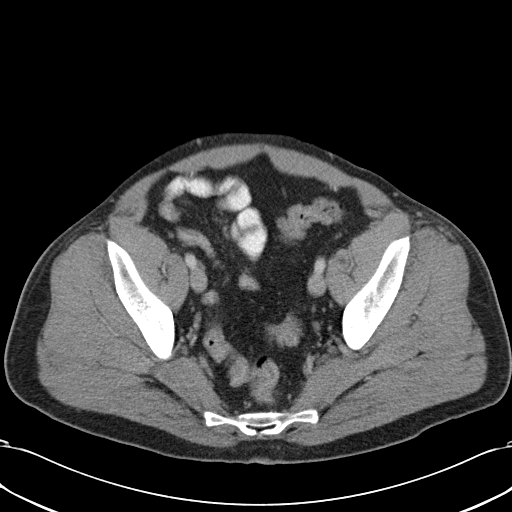
[im 25/93  soft-tissue]
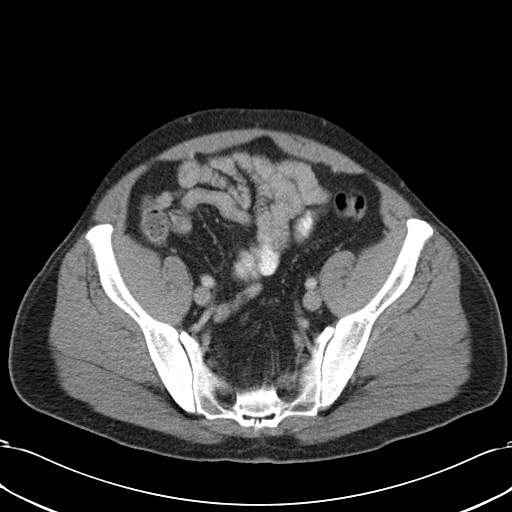
[im 33/93  soft-tissue]
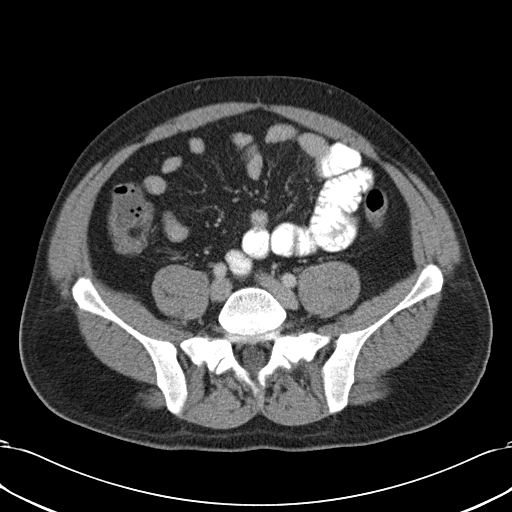
[im 41/93  soft-tissue]
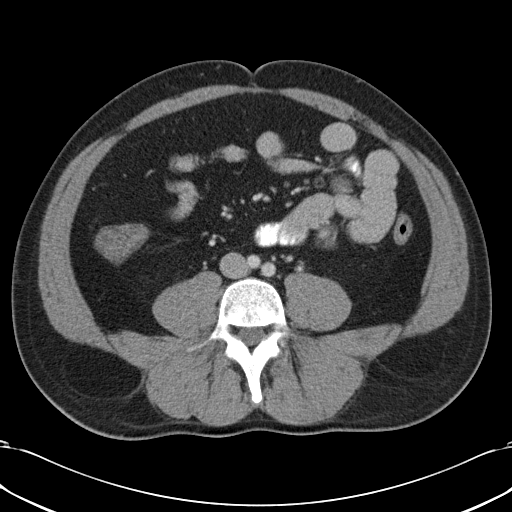
[im 49/93  soft-tissue]
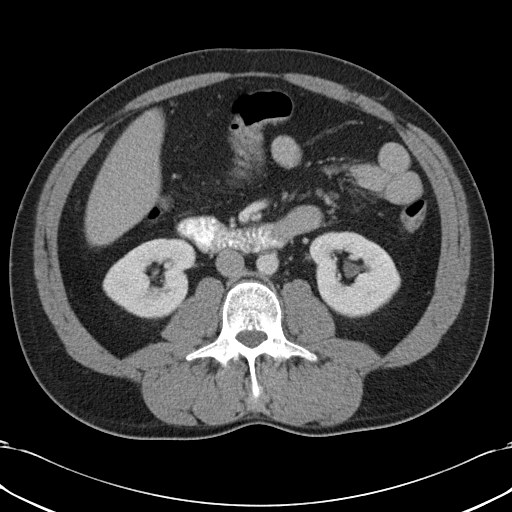
[im 53/93  soft-tissue]
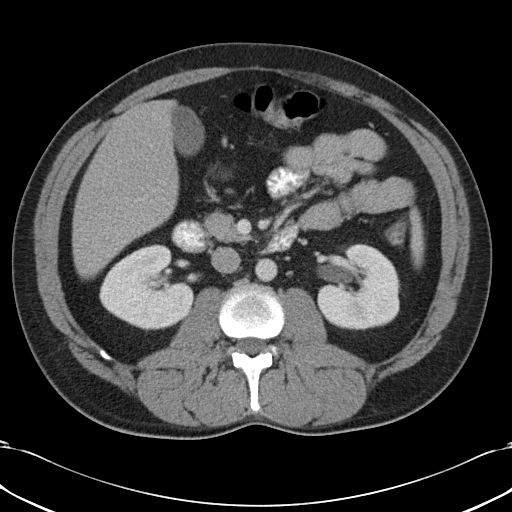
[im 61/93  soft-tissue]
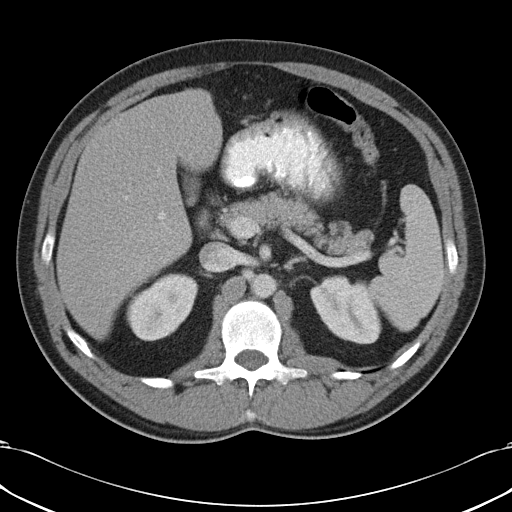
[im 61/93  bone]
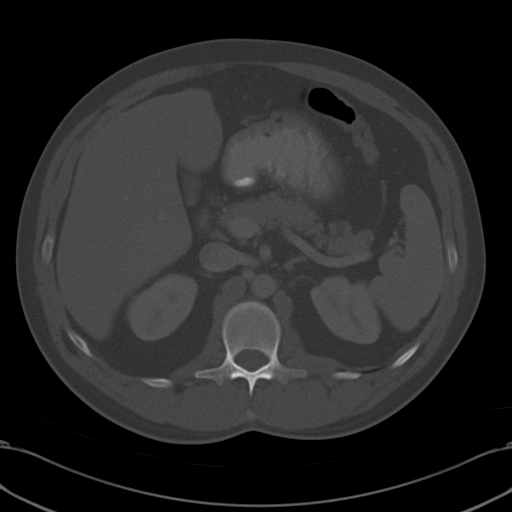
[im 69/93  soft-tissue]
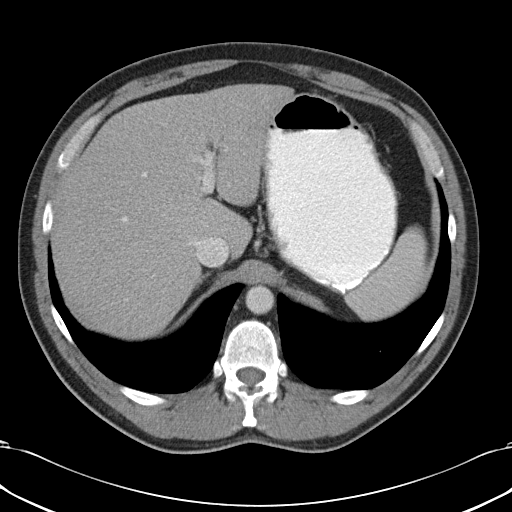
[im 73/93  soft-tissue]
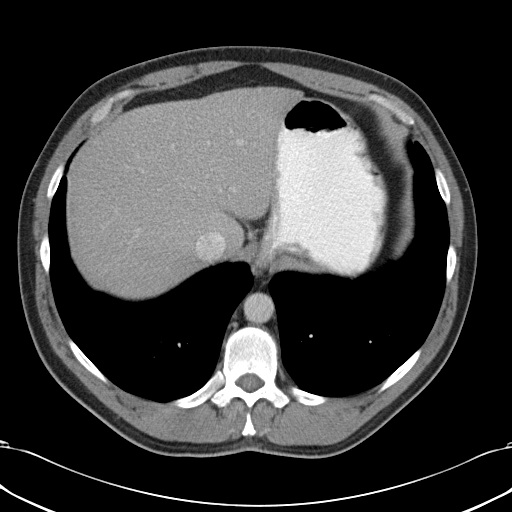
[im 77/93  lung]
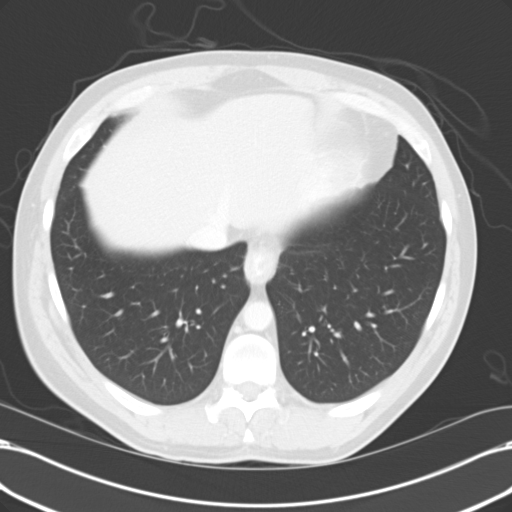
[im 81/93  soft-tissue]
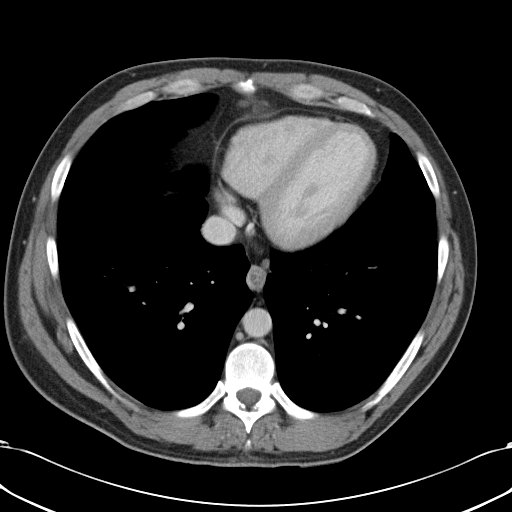
[im 81/93  lung]
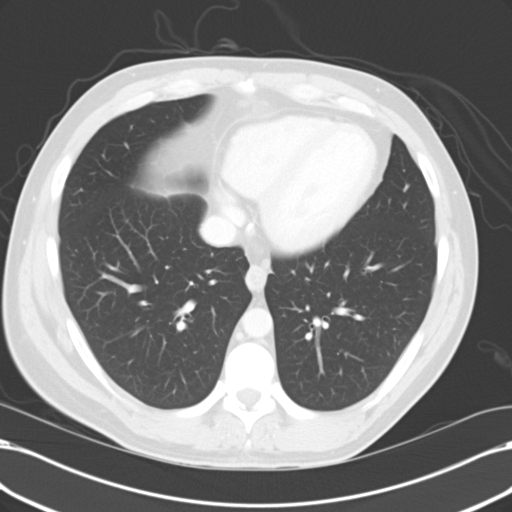
[im 85/93  lung]
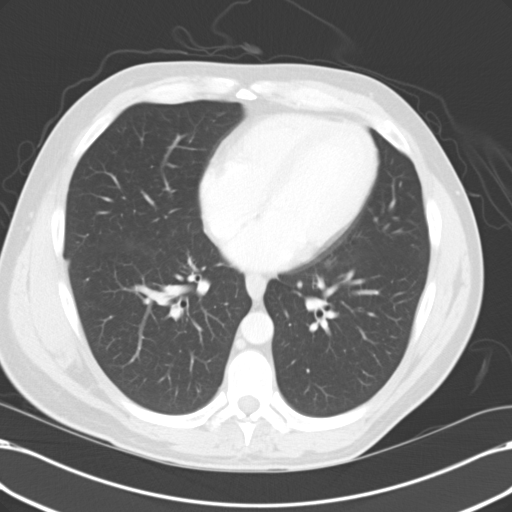
[im 89/93  soft-tissue]
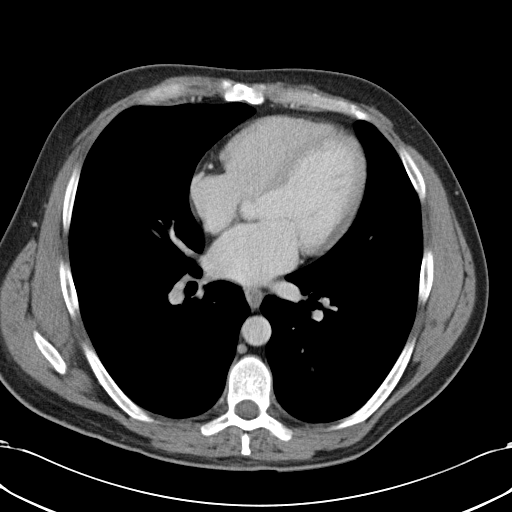
[im 89/93  lung]
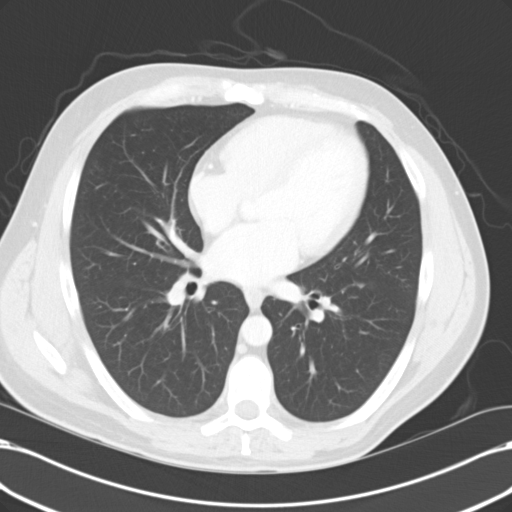

[15 of 32 positions shown; findings below may reference images not displayed]

FINDINGS: Limited images through the lung bases demonstrate no
significant appreciable abnormality. The heart size is within
normal limits. No pleural or pericardial effusion.

Diffuse low attenuation of the liver without focal abnormality.
Unremarkable biliary system, spleen, pancreas, adrenal glands.

There are a few bilateral too small to further characterize
hypodensities.  Minimal pelviectasis on the left no hydroureter.
There is a 3 mm mid left ureteral stone (image 53).

No bowel obstruction.  No CT evidence for colitis.  Normal
appendix.  No free fluid or free intraperitoneal air.  No
lymphadenopathy.  Normal caliber vasculature.

Thin-walled bladder.  Small fat containing right inguinal hernia.

No acute osseous abnormality.
IMPRESSION: 3 mm mid left ureteral stone with associated mild pelviectasis.

Bilateral too small to further characterize renal hypodensities and
a 1.3 cm indeterminate hypodensity within the left lower pole.  A
non emergent renal MRI was previously (and is again) recommended.

Hepatic steatosis.

## 2012-12-29 IMAGING — CR DG ABDOMEN 2V
3 series · 3 of 3 positions shown · non-contrast
Comparison: CT abdomen pelvis 10/11/2011

CLINICAL DATA: Abdominal pain. Left-sided dating back pain flank
pain.  Vomiting.

ABDOMEN - 2 VIEW

[AP (1 of 2)]
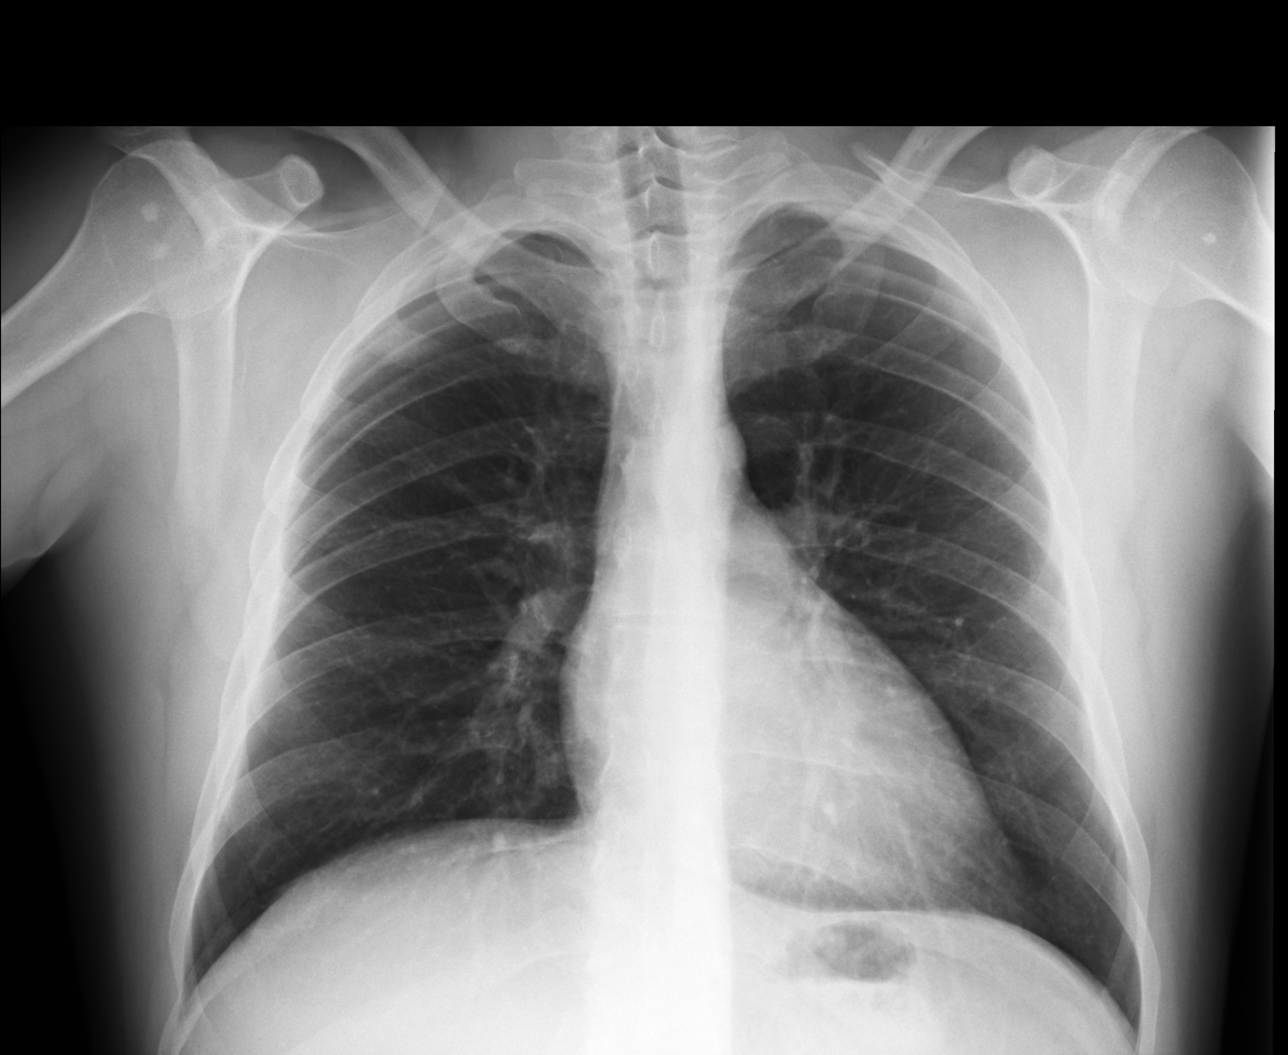

[AP (2 of 2)]
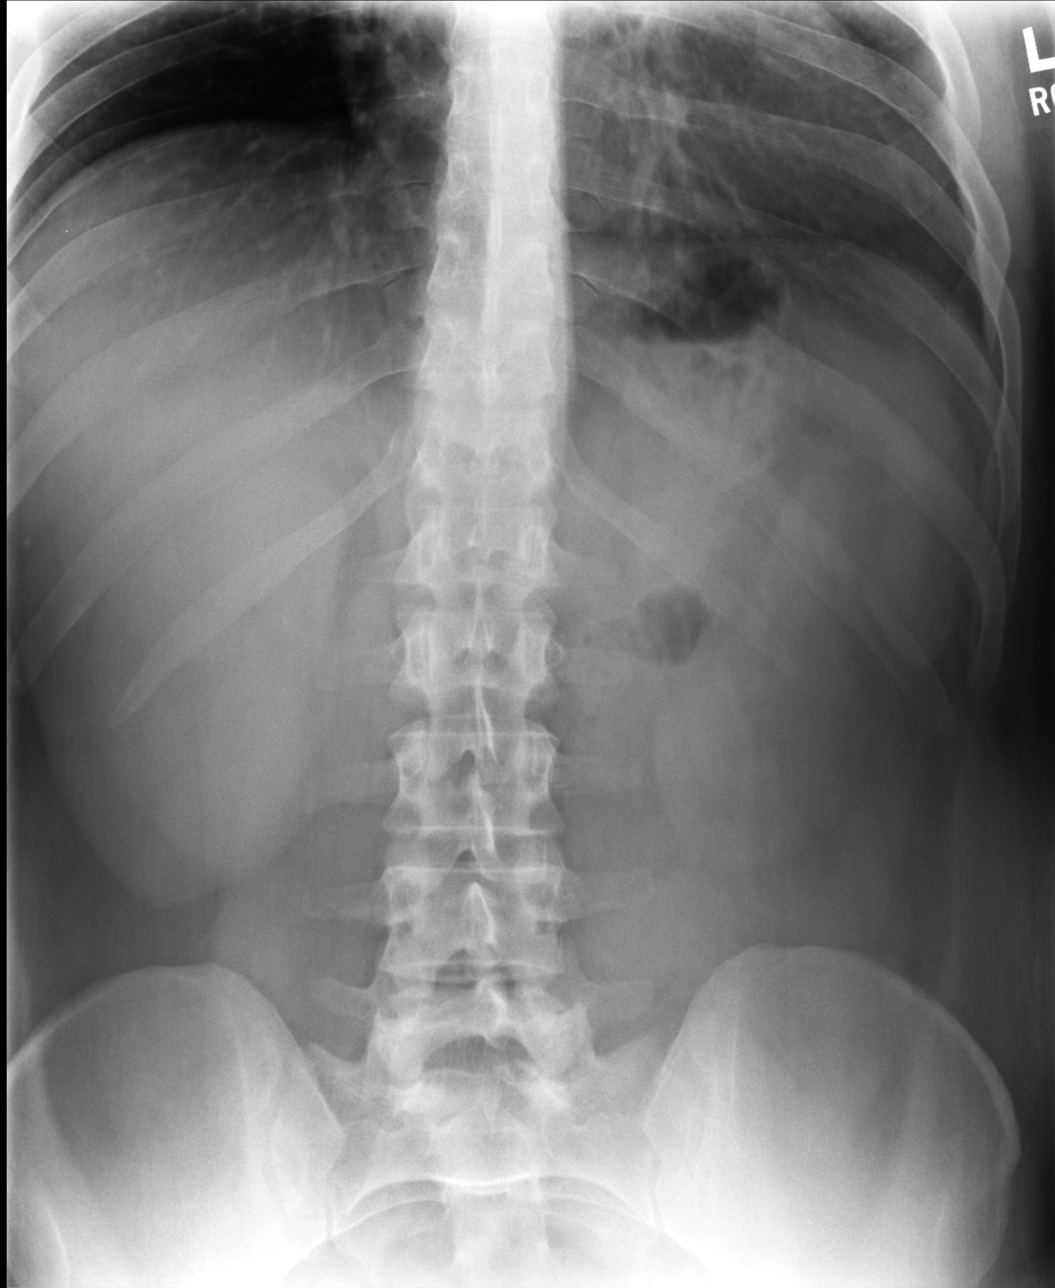

[ap lld]
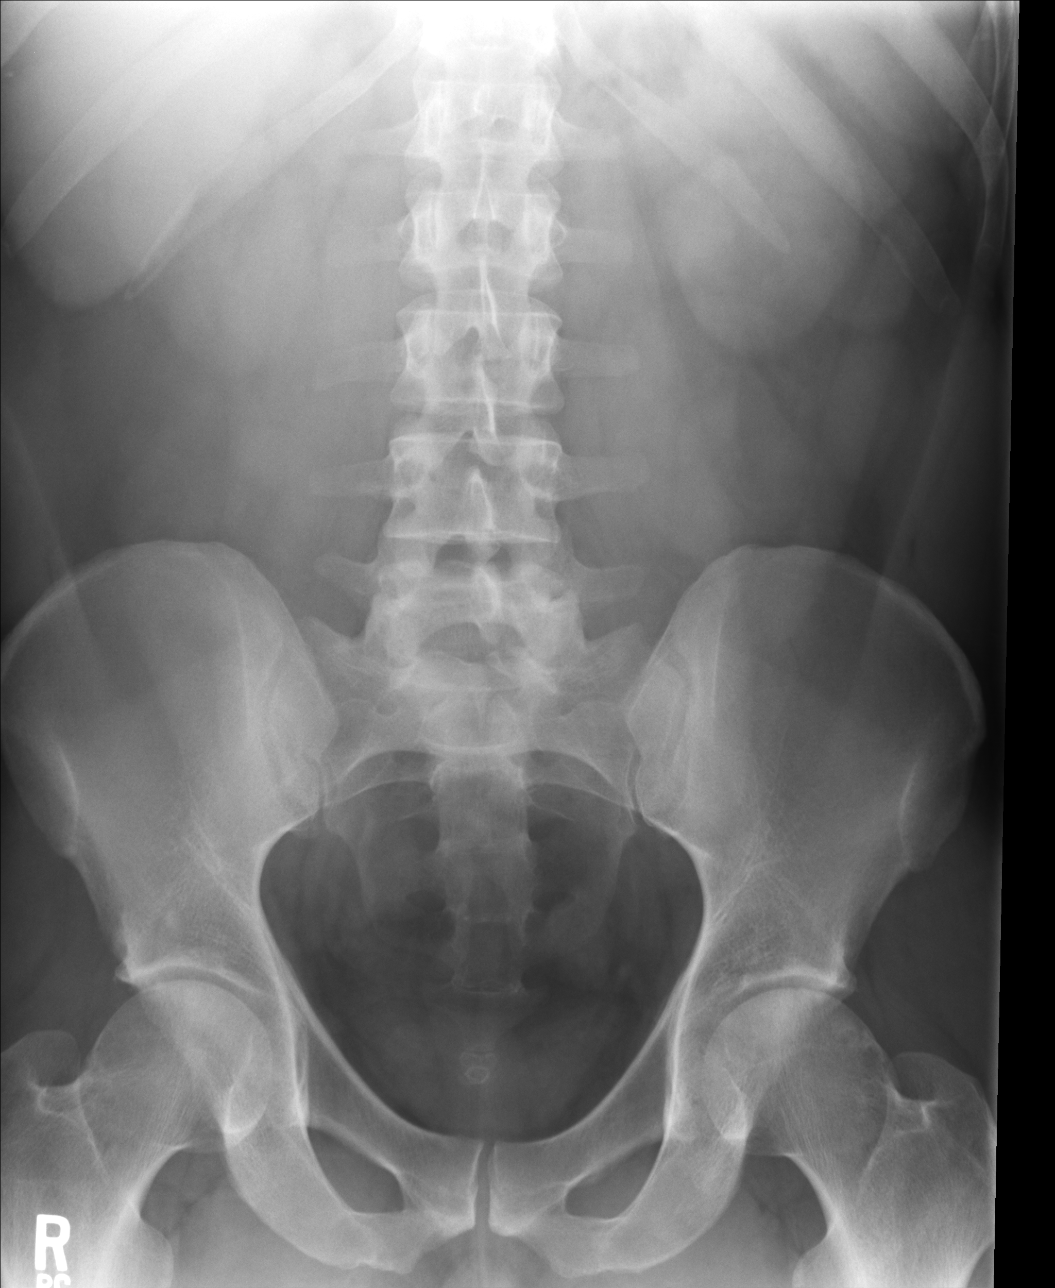

[3 of 3 positions shown; findings below may reference images not displayed]

FINDINGS: The heart, mediastinal, and hilar contours are normal.
The lungs are clear.  Sub-centimeter sclerotic foci in the right
humeral head and left humeral head likely reflect benign bone
islands.

No free intraperitoneal air identified.  There is a paucity of
abdominal and pelvic bowel gas.  This can be seen in the setting of
vomiting.  No evidence of bowel obstruction.  No visible stool.  No
radiopaque urinary tract calculus is seen.  The bones appear within
normal limits.
IMPRESSION: Paucity of bowel gas. This can be seen in the setting of vomiting.
No evidence of bowel obstruction or urinary tract stone.

## 2014-02-09 ENCOUNTER — Ambulatory Visit (INDEPENDENT_AMBULATORY_CARE_PROVIDER_SITE_OTHER): Payer: BC Managed Care – PPO | Admitting: Family Medicine

## 2014-02-09 VITALS — BP 120/72 | HR 63 | Temp 98.3°F | Resp 18 | Ht 67.0 in | Wt 170.0 lb

## 2014-02-09 DIAGNOSIS — B354 Tinea corporis: Secondary | ICD-10-CM

## 2014-02-09 MED ORDER — KETOCONAZOLE 2 % EX CREA: 1.0000 "application " | TOPICAL_CREAM | Freq: Every day | CUTANEOUS | Status: AC

## 2014-02-09 NOTE — Progress Notes (Signed)
° °  Subjective:  This chart was scribed for Hunter SidleKurt Lauenstein, MD by Ashley JacobsBrittany Andrews, [facility] Sc[facility]. The patient was seen in room and the patient's care was started at 12:11 PM.   Patient ID: Hunter Lopez, male    DOB: 09-Jul-1976, 38 y.o.   MRN: [id number]  Chief Complaint  Patient presents with   Rash    right upper leg x6 mths    Rash   HPI Comments: Hunter Lopez is a 38 y.o. male who presents to the Emergency Department complaining of circular rash to his medial right upper thigh for the past six months. The rash presents with redness, itching, and flaking. Pt stopped doing Hot Yoga due to irritation and worsening of the rash. Nothing seems to make the rash better. 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   Review of Systems  Skin: Positive for color change (erythema) and rash.       Objective:   Physical Exam  She has a 5 inch in diameter erythematous plaque on his right medial thigh with some central clearing and circumferential elevation and scaliness.     Assessment & Plan:    Tinea corporis - Plan: ketoconazole (NIZORAL) 2 % cream  Signed, Hunter SidleKurt Lauenstein, MD

## 2014-02-09 NOTE — Patient Instructions (Signed)
Body Ringworm °Ringworm (tinea corporis) is a fungal infection of the skin on the body. This infection is not caused by worms, but is actually caused by a fungus. Fungus normally lives on the top of your skin and can be useful. However, in the case of ringworms, the fungus grows out of control and causes a skin infection. It can involve any area of skin on the body and can spread easily from one person to another (contagious). Ringworm is a common problem for children, but it can affect adults as well. Ringworm is also often found in athletes, especially wrestlers who share equipment and mats.  °CAUSES  °Ringworm of the body is caused by a fungus called dermatophyte. It can spread by: °· Touching other people who are infected. °· Touching infected pets. °· Touching or sharing objects that have been in contact with the infected person or pet (hats, combs, towels, clothing, sports equipment). °SYMPTOMS  °· Itchy, raised red spots and bumps on the skin. °· Ring-shaped rash. °· Redness near the border of the rash with a clear center. °· Dry and scaly skin on or around the rash. °Not every person develops a ring-shaped rash. Some develop only the red, scaly patches. °DIAGNOSIS  °Most often, ringworm can be diagnosed by performing a skin exam. Your caregiver may choose to take a skin scraping from the affected area. The sample will be examined under the microscope to see if the fungus is present.  °TREATMENT  °Body ringworm may be treated with a topical antifungal cream or ointment. Sometimes, an antifungal shampoo that can be used on your body is prescribed. You may be prescribed antifungal medicines to take by mouth if your ringworm is severe, keeps coming back, or lasts a long time.  °HOME CARE INSTRUCTIONS  °· Only take over-the-counter or prescription medicines as directed by your caregiver. °· Wash the infected area and dry it completely before applying your cream or ointment. °· When using antifungal shampoo to  treat the ringworm, leave the shampoo on the body for 3 5 minutes before rinsing.    °· Wear loose clothing to stop clothes from rubbing and irritating the rash. °· Wash or change your bed sheets every night while you have the rash. °· Have your pet treated by your veterinarian if it has the same infection. °To prevent ringworm:  °· Practice good hygiene. °· Wear sandals or shoes in public places and showers. °· Do not share personal items with others. °· Avoid touching red patches of skin on other people. °· Avoid touching pets that have bald spots or wash your hands after doing so. °SEEK MEDICAL CARE IF:  °· Your rash continues to spread after 7 days of treatment. °· Your rash is not gone in 4 weeks. °· The area around your rash becomes red, warm, tender, and swollen. °Document Released: 10/05/2000 Document Revised: 07/02/2012 Document Reviewed: 04/21/2012 °ExitCare® Patient Information ©2014 [employer]. ° °

## 2015-07-11 ENCOUNTER — Telehealth: Payer: Self-pay

## 2015-07-11 ENCOUNTER — Ambulatory Visit (INDEPENDENT_AMBULATORY_CARE_PROVIDER_SITE_OTHER): Payer: Self-pay | Admitting: Emergency Medicine

## 2015-07-11 VITALS — BP 114/70 | HR 70 | Temp 99.0°F | Resp 16 | Ht 68.0 in | Wt 178.8 lb

## 2015-07-11 DIAGNOSIS — B356 Tinea cruris: Secondary | ICD-10-CM

## 2015-07-11 DIAGNOSIS — N5089 Other specified disorders of the male genital organs: Secondary | ICD-10-CM

## 2015-07-11 DIAGNOSIS — N508 Other specified disorders of male genital organs: Secondary | ICD-10-CM

## 2015-07-11 MED ORDER — SULFAMETHOXAZOLE-TRIMETHOPRIM 800-160 MG PO TABS: 1.0000 | ORAL_TABLET | Freq: Two times a day (BID) | ORAL | Status: DC

## 2015-07-11 MED ORDER — TERBINAFINE HCL 250 MG PO TABS: 250.0000 mg | ORAL_TABLET | Freq: Every day | ORAL | Status: AC

## 2015-07-11 MED ORDER — HYDROCODONE-ACETAMINOPHEN 5-325 MG PO TABS: 1.0000 | ORAL_TABLET | ORAL | Status: AC | PRN

## 2015-07-11 NOTE — Telephone Encounter (Signed)
Talked to Pt. And his prescription is ready for pick up.

## 2015-07-11 NOTE — Addendum Note (Signed)
Addended by: Carmelina Dane on: 07/11/2015 06:43 PM   Modules accepted: Orders

## 2015-07-11 NOTE — Patient Instructions (Signed)
Jock Itch Jock itch is a fungal infection of the skin in the groin area. It is sometimes called "ringworm" even though it is not caused by a worm. A fungus is a type of germ that thrives in dark, damp places.  CAUSES  This infection may spread from:  A fungus infection elsewhere on the body (such as athlete's foot).  Sharing towels or clothing. This infection is more common in:  Hot, humid climates.  People who wear tight-fitting clothing or wet bathing suits for long periods of time.  Athletes.  Overweight people.  People with diabetes. SYMPTOMS  Jock itch causes the following symptoms:  Red, pink or brown rash in the groin. Rash may spread to the thighs, anus, and buttocks.  Itching. DIAGNOSIS  Your caregiver may make the diagnosis by looking at the rash. Sometimes a skin scraping will be sent to test for fungus. Testing can be done either by looking under the microscope or by doing a culture (test to try to grow the fungus). A culture can take up to 2 weeks to come back. TREATMENT  Jock itch may be treated with:  Skin cream or ointment to kill fungus.  Medicine by mouth to kill fungus.  Skin cream or ointment to calm the itching.  Compresses or medicated powders to dry the infected skin. HOME CARE INSTRUCTIONS   Be sure to treat the rash completely. Follow your caregiver's instructions. It can take a couple of weeks to treat. If you do not treat the infection long enough, the rash can come back.  Wear loose-fitting clothing.  Men should wear cotton boxer shorts.  Women should wear cotton underwear.  Avoid hot baths.  Dry the groin area well after bathing. SEEK MEDICAL CARE IF:   Your rash is worse.  Your rash is spreading.  Your rash returns after treatment is finished.  Your rash is not gone in 4 weeks. Fungal infections are slow to respond to treatment. Some redness may remain for several weeks after the fungus is gone. SEEK IMMEDIATE MEDICAL CARE  IF:  The area becomes red, warm, tender, and swollen.  You have a fever. Document Released: 09/28/2002 Document Revised: 12/31/2011 Document Reviewed: 08/27/2008 ExitCare Patient Information 2015 [employer]. This information is not intended to replace advice given to you by your health care provider. Make sure you discuss any questions you have with your health care provider.  

## 2015-07-11 NOTE — Telephone Encounter (Signed)
Per Dr. Dareen Piano, call pt and advise that we sent in an abx. Done Pt wants to know if we can call him in a few pain pills. Thanks

## 2015-07-11 NOTE — Progress Notes (Addendum)
Subjective:  Patient ID: Hunter Lopez, male    DOB: August 13, 1976  Age: 39 y.o. MRN: [id number]  CC: Rash and cyst   HPI Kyser Wandel presents  at the base of his scrotum. For chills. No history of injury. He said this popped up over the last 2 or 3 days. He also has a rather severe tinea cruris. That's been there for 3 weeks despite his use of Vaseline and Gold Bond powder  History Laderius has a past medical history of Renal disorder; Kidney stone; MRSA infection; Kidney stone; and Anxiety.   He has no past surgical history on file.   His  family history includes Cancer in his father.  He   reports that he has never smoked. He does not have any smokeless tobacco history on file. He reports that he does not drink alcohol or use illicit drugs.  Outpatient Prescriptions Prior to Visit  Medication Sig Dispense Refill  . HYDROcodone-acetaminophen (VICODIN) 5-500 MG per tablet One every 4-6 hours for severe pain only (Patient not taking: Reported on 07/11/2015) 20 tablet 0  . ibuprofen (ADVIL,MOTRIN) 200 MG tablet Take 200 mg by mouth every 6 (six) hours as needed. Pain or headache     . ketoconazole (NIZORAL) 2 % cream Apply 1 application topically daily. (Patient not taking: Reported on 07/11/2015) 30 g 0  . Tamsulosin HCl (FLOMAX) 0.4 MG CAPS Take 1 capsule (0.4 mg total) by mouth daily. (Patient not taking: Reported on 07/11/2015) 7 capsule 0   No facility-administered medications prior to visit.    Social History   Social History  . Marital Status: Single    Spouse Name: N/A  . Number of Children: N/A  . Years of Education: N/A   Social History Main Topics  . Smoking status: Never Smoker   . Smokeless tobacco: None  . Alcohol Use: No  . Drug Use: No  . Sexual Activity: Not Asked   Other Topics Concern  . None   Social History Narrative     Review of Systems  Constitutional: Negative for fever, chills and appetite change.  HENT: Negative for  congestion, ear pain, postnasal drip, sinus pressure and sore throat.   Eyes: Negative for pain and redness.  Respiratory: Negative for cough, shortness of breath and wheezing.   Cardiovascular: Negative for leg swelling.  Gastrointestinal: Negative for nausea, vomiting, abdominal pain, diarrhea, constipation and blood in stool.  Endocrine: Negative for polyuria.  Genitourinary: Negative for dysuria, urgency, frequency and flank pain.  Musculoskeletal: Negative for gait problem.  Skin: Negative for rash.  Neurological: Negative for weakness and headaches.  Psychiatric/Behavioral: Negative for confusion and decreased concentration. The patient is not nervous/anxious.     Objective:  BP 114/70 mmHg  Pulse 70  Temp(Src) 99 F (37.2 C) (Oral)  Resp 16  Ht  (1.727 m)  Wt 178 lb 12.8 oz (81.103 kg)  BMI 27.19 kg/m2  SpO2 98%  Physical Exam  Constitutional: He is oriented to person, place, and time. He appears well-developed and well-nourished.  HENT:  Head: Normocephalic and atraumatic.  Eyes: Conjunctivae are normal. Pupils are equal, round, and reactive to light.  Pulmonary/Chest: Effort normal.  Musculoskeletal: He exhibits no edema.  Neurological: He is alert and oriented to person, place, and time.  Skin: Skin is dry.  Psychiatric: He has a normal mood and affect. His behavior is normal. Thought content normal.   Is a 2.5 cm diameter mass that's firm and not fluctuant at the base  of his scrotum there is no cellulitis attempted aspirate was made was unsuccessful   Assessment & Plan:   Carolos was seen today for rash and cyst.  Diagnoses and all orders for this visit:  Perineal mass in male -     Ambulatory referral to General Surgery -     US Scrotum; Future  Tinea cruris  Other orders -     terbinafine (LAMISIL) 250 MG tablet; Take 1 tablet (250 mg total) by mouth daily. -     sulfamethoxazole-trimethoprim (BACTRIM DS,SEPTRA DS) 800-160 MG per tablet; Take  1 tablet by mouth 2 (two) times daily. -     HYDROcodone-acetaminophen (NORCO) 5-325 MG per tablet; Take 1-2 tablets by mouth every 4 (four) hours as needed.   I am having Mr. Sigal start on terbinafine, sulfamethoxazole-trimethoprim, and HYDROcodone-acetaminophen. I am also having him maintain his ibuprofen, tamsulosin, HYDROcodone-acetaminophen, and ketoconazole.  Meds ordered this encounter  Medications  . terbinafine (LAMISIL) 250 MG tablet    Sig: Take 1 tablet (250 mg total) by mouth daily.    Dispense:  15 tablet    Refill:  0  . sulfamethoxazole-trimethoprim (BACTRIM DS,SEPTRA DS) 800-160 MG per tablet    Sig: Take 1 tablet by mouth 2 (two) times daily.    Dispense:  20 tablet    Refill:  0  . HYDROcodone-acetaminophen (NORCO) 5-325 MG per tablet    Sig: Take 1-2 tablets by mouth every 4 (four) hours as needed.    Dispense:  30 tablet    Refill:  0    Appropriate red flag conditions were discussed with the patient as well as actions that should be taken.  Patient expressed his understanding.  Follow-up: Return if symptoms worsen or fail to improve.  Carmelina Dane, MD

## 2015-07-12 ENCOUNTER — Telehealth: Payer: Self-pay

## 2015-07-12 NOTE — Telephone Encounter (Signed)
Pt states he was running a fever last night and now it is 101. Advised pt to come in but he would prefer a call back at [phone]

## 2015-07-12 NOTE — Telephone Encounter (Signed)
Spoke with pt, he states he had a fever but his fever broke and just wanted to know the status of his ultrasound. Referrals can you help?  It seems it was just put in yesterday.

## 2015-07-16 ENCOUNTER — Ambulatory Visit (INDEPENDENT_AMBULATORY_CARE_PROVIDER_SITE_OTHER): Payer: Self-pay | Admitting: Emergency Medicine

## 2015-07-16 VITALS — BP 122/74 | HR 62 | Temp 98.0°F | Resp 16 | Ht 68.0 in | Wt 180.4 lb

## 2015-07-16 DIAGNOSIS — K612 Anorectal abscess: Secondary | ICD-10-CM

## 2015-07-16 MED ORDER — DOXYCYCLINE HYCLATE 100 MG PO CAPS: 100.0000 mg | ORAL_CAPSULE | Freq: Two times a day (BID) | ORAL | Status: AC

## 2015-07-16 NOTE — Progress Notes (Signed)
Subjective:  Patient ID: Hunter Lopez, male    DOB: 1975-11-04  Age: 39 y.o. MRN: [id number]  CC: Follow-up; Fever; Allergic Reaction; and Cough   HPI Hunter Lopez presents   With concerns that he is having allergic reaction would've medications. He was treated for a perineal abscess with Septra and the last several days it spontaneously ruptured and is draining pus so much better he is able sit comfortably without any difficulty.  as  no feverr or chills. He  has some migratory hives  History Hunter Lopez has a past medical history of Renal disorder; Kidney stone; MRSA infection; Kidney stone; and Anxiety.   He has no past surgical history on file.   His  family history includes Cancer in his father.  He   reports that he has never smoked. He does not have any smokeless tobacco history on file. He reports that he does not drink alcohol or use illicit drugs.  Outpatient Prescriptions Prior to Visit  Medication Sig Dispense Refill  . HYDROcodone-acetaminophen (NORCO) 5-325 MG per tablet Take 1-2 tablets by mouth every 4 (four) hours as needed. 30 tablet 0  . terbinafine (LAMISIL) 250 MG tablet Take 1 tablet (250 mg total) by mouth daily. 15 tablet 0  . sulfamethoxazole-trimethoprim (BACTRIM DS,SEPTRA DS) 800-160 MG per tablet Take 1 tablet by mouth 2 (two) times daily. 20 tablet 0  . HYDROcodone-acetaminophen (VICODIN) 5-500 MG per tablet One every 4-6 hours for severe pain only (Patient not taking: Reported on 07/11/2015) 20 tablet 0  . ibuprofen (ADVIL,MOTRIN) 200 MG tablet Take 200 mg by mouth every 6 (six) hours as needed. Pain or headache     . ketoconazole (NIZORAL) 2 % cream Apply 1 application topically daily. (Patient not taking: Reported on 07/11/2015) 30 g 0  . Tamsulosin HCl (FLOMAX) 0.4 MG CAPS Take 1 capsule (0.4 mg total) by mouth daily. (Patient not taking: Reported on 07/11/2015) 7 capsule 0   No facility-administered medications prior to visit.    Social  History   Social History  . Marital Status: Single    Spouse Name: N/A  . Number of Children: N/A  . Years of Education: N/A   Social History Main Topics  . Smoking status: Never Smoker   . Smokeless tobacco: None  . Alcohol Use: No  . Drug Use: No  . Sexual Activity: Not Asked   Other Topics Concern  . None   Social History Narrative     Review of Systems  Constitutional: Negative for fever, chills and appetite change.  HENT: Negative for congestion, ear pain, postnasal drip, sinus pressure and sore throat.   Eyes: Negative for pain and redness.  Respiratory: Negative for cough, shortness of breath and wheezing.   Cardiovascular: Negative for leg swelling.  Gastrointestinal: Negative for nausea, vomiting, abdominal pain, diarrhea, constipation and blood in stool.  Endocrine: Negative for polyuria.  Genitourinary: Negative for dysuria, urgency, frequency and flank pain.  Musculoskeletal: Negative for gait problem.  Skin: Negative for rash.  Neurological: Negative for weakness and headaches.  Psychiatric/Behavioral: Negative for confusion and decreased concentration. The patient is not nervous/anxious.     Objective:  BP 122/74 mmHg  Pulse 62  Temp(Src) 98 F (36.7 C) (Oral)  Resp 16  Ht  (1.727 m)  Wt 180 lb 6.4 oz (81.829 kg)  BMI 27.44 kg/m2  SpO2 99%  Physical Exam  Constitutional: He is oriented to person, place, and time. He appears well-developed and well-nourished.  HENT:  Head:  Normocephalic and atraumatic.  Eyes: Conjunctivae are normal. Pupils are equal, round, and reactive to light.  Pulmonary/Chest: Effort normal.  Musculoskeletal: He exhibits no edema.  Neurological: He is alert and oriented to person, place, and time.  Skin: Skin is dry. Lesion and rash noted. Rash is pustular. There is erythema.  Psychiatric: He has a normal mood and affect. His behavior is normal. Thought content normal.    is improving  Abscess on the perineum. This is  spontaneously draining    Assessment & Plan:   Hunter Lopez was seen today for follow-up, fever, allergic reaction and cough.  Diagnoses and all orders for this visit:  Abscess of anal or rectal region  Other orders -     doxycycline (VIBRAMYCIN) 100 MG capsule; Take 1 capsule (100 mg total) by mouth 2 (two) times daily.   I have discontinued Mr. Guiney sulfamethoxazole-trimethoprim. I am also having him start on doxycycline. Additionally, I am having him maintain his ibuprofen, tamsulosin, HYDROcodone-acetaminophen, ketoconazole, terbinafine, HYDROcodone-acetaminophen, and DiphenhydrAMINE HCl (BENADRYL ALLERGY PO).  Meds ordered this encounter  Medications  . DiphenhydrAMINE HCl (BENADRYL ALLERGY PO)    Sig: Take by mouth.  . doxycycline (VIBRAMYCIN) 100 MG capsule    Sig: Take 1 capsule (100 mg total) by mouth 2 (two) times daily.    Dispense:  20 capsule    Refill:  0    Appropriate red flag conditions were discussed with the patient as well as actions that should be taken.  Patient expressed his understanding.  Follow-up: Return if symptoms worsen or fail to improve.  Carmelina Dane, MD

## 2015-07-16 NOTE — Patient Instructions (Signed)
